# Patient Record
Sex: Female | Born: 1975 | Race: White | Hispanic: No | Marital: Married | Smoking: Never smoker
Health system: Southern US, Community
[De-identification: ages and names within clinical notes are randomized; demographics above are authoritative.]

## PROBLEM LIST (undated history)

## (undated) DIAGNOSIS — F32A Depression, unspecified: Secondary | ICD-10-CM

## (undated) DIAGNOSIS — M545 Low back pain: Secondary | ICD-10-CM

## (undated) DIAGNOSIS — E878 Other disorders of electrolyte and fluid balance, not elsewhere classified: Secondary | ICD-10-CM

## (undated) DIAGNOSIS — F329 Major depressive disorder, single episode, unspecified: Secondary | ICD-10-CM

## (undated) DIAGNOSIS — G43909 Migraine, unspecified, not intractable, without status migrainosus: Secondary | ICD-10-CM

## (undated) DIAGNOSIS — E890 Postprocedural hypothyroidism: Secondary | ICD-10-CM

## (undated) HISTORY — DX: Migraine, unspecified, not intractable, without status migrainosus: G43.909

## (undated) HISTORY — DX: Major depressive disorder, single episode, unspecified: F32.9

## (undated) HISTORY — PX: WISDOM TOOTH EXTRACTION: SHX21

## (undated) HISTORY — DX: Postprocedural hypothyroidism: E89.0

## (undated) HISTORY — DX: Low back pain: M54.5

## (undated) HISTORY — DX: Depression, unspecified: F32.A

## (undated) HISTORY — DX: Other disorders of electrolyte and fluid balance, not elsewhere classified: E87.8

---

## 2004-06-15 HISTORY — PX: THYROIDECTOMY: SHX17

## 2005-06-15 HISTORY — PX: CERVICAL CONE BIOPSY: SUR198

## 2007-06-16 LAB — HM MAMMOGRAPHY

## 2012-04-05 ENCOUNTER — Ambulatory Visit (INDEPENDENT_AMBULATORY_CARE_PROVIDER_SITE_OTHER): Payer: Managed Care, Other (non HMO) | Admitting: Family Medicine

## 2012-04-05 ENCOUNTER — Encounter: Payer: Self-pay | Admitting: Family Medicine

## 2012-04-05 VITALS — BP 125/82 | HR 85 | Temp 98.6°F | Ht 71.0 in | Wt 205.4 lb

## 2012-04-05 DIAGNOSIS — E079 Disorder of thyroid, unspecified: Secondary | ICD-10-CM

## 2012-04-05 DIAGNOSIS — M545 Low back pain, unspecified: Secondary | ICD-10-CM

## 2012-04-05 DIAGNOSIS — R42 Dizziness and giddiness: Secondary | ICD-10-CM

## 2012-04-05 DIAGNOSIS — Z0000 Encounter for general adult medical examination without abnormal findings: Secondary | ICD-10-CM

## 2012-04-05 MED ORDER — MECLIZINE HCL 25 MG PO TABS: 25.0000 mg | ORAL_TABLET | Freq: Three times a day (TID) | ORAL | Status: DC | PRN

## 2012-04-05 NOTE — Patient Instructions (Addendum)
Digestive health probiotic Health by [facility]   Preventive Care for Adults, Female A healthy lifestyle and preventive care can promote health and wellness. Preventive health guidelines for women include the following key practices.  A routine yearly physical is a good way to check with your caregiver about your health and preventive screening. It is a chance to share any concerns and updates on your health, and to receive a thorough exam.  Visit your dentist for a routine exam and preventive care every 6 months. Brush your teeth twice a day and floss once a day. Good oral hygiene prevents tooth decay and gum disease.  The frequency of eye exams is based on your age, health, family medical history, use of contact lenses, and other factors. Follow your caregiver's recommendations for frequency of eye exams.  Eat a healthy diet. Foods like vegetables, fruits, whole grains, low-fat dairy products, and lean protein foods contain the nutrients you need without too many calories. Decrease your intake of foods high in solid fats, added sugars, and salt. Eat the right amount of calories for you.Get information about a proper diet from your caregiver, if necessary.  Regular physical exercise is one of the most important things you can do for your health. Most adults should get at least 150 minutes of moderate-intensity exercise (any activity that increases your heart rate and causes you to sweat) each week. In addition, most adults need muscle-strengthening exercises on 2 or more days a week.  Maintain a healthy weight. The body mass index (BMI) is a screening tool to identify possible weight problems. It provides an estimate of body fat based on height and weight. Your caregiver can help determine your BMI, and can help you achieve or maintain a healthy weight.For adults 20 years and older:  A BMI below 18.5 is considered underweight.  A BMI of 18.5 to 24.9 is normal.  A BMI of 25 to 29.9 is considered  overweight.  A BMI of 30 and above is considered obese.  Maintain normal blood lipids and cholesterol levels by exercising and minimizing your intake of saturated fat. Eat a balanced diet with plenty of fruit and vegetables. Blood tests for lipids and cholesterol should begin at age 74 and be repeated every 5 years. If your lipid or cholesterol levels are high, you are over 50, or you are at high risk for heart disease, you may need your cholesterol levels checked more frequently.Ongoing high lipid and cholesterol levels should be treated with medicines if diet and exercise are not effective.  If you smoke, find out from your caregiver how to quit. If you do not use tobacco, do not start.  If you are pregnant, do not drink alcohol. If you are breastfeeding, be very cautious about drinking alcohol. If you are not pregnant and choose to drink alcohol, do not exceed 1 drink per day. One drink is considered to be 12 ounces (355 mL) of beer, 5 ounces (148 mL) of wine, or 1.5 ounces (44 mL) of liquor.  Avoid use of street drugs. Do not share needles with anyone. Ask for help if you need support or instructions about stopping the use of drugs.  High blood pressure causes heart disease and increases the risk of stroke. Your blood pressure should be checked at least every 1 to 2 years. Ongoing high blood pressure should be treated with medicines if weight loss and exercise are not effective.  If you are 32 to 36 years old, ask your caregiver if you  should take aspirin to prevent strokes.  Diabetes screening involves taking a blood sample to check your fasting blood sugar level. This should be done once every 3 years, after age 69, if you are within normal weight and without risk factors for diabetes. Testing should be considered at a younger age or be carried out more frequently if you are overweight and have at least 1 risk factor for diabetes.  Breast cancer screening is essential preventive care for  women. You should practice "breast self-awareness." This means understanding the normal appearance and feel of your breasts and may include breast self-examination. Any changes detected, no matter how small, should be reported to a caregiver. Women in their 47s and 30s should have a clinical breast exam (CBE) by a caregiver as part of a regular health exam every 1 to 3 years. After age 39, women should have a CBE every year. Starting at age 41, women should consider having a mammography (breast X-ray test) every year. Women who have a family history of breast cancer should talk to their caregiver about genetic screening. Women at a high risk of breast cancer should talk to their caregivers about having magnetic resonance imaging (MRI) and a mammography every year.  The Pap test is a screening test for cervical cancer. A Pap test can show cell changes on the cervix that might become cervical cancer if left untreated. A Pap test is a procedure in which cells are obtained and examined from the lower end of the uterus (cervix).  Women should have a Pap test starting at age 32.  Between ages 42 and 67, Pap tests should be repeated every 2 years.  Beginning at age 68, you should have a Pap test every 3 years as long as the past 3 Pap tests have been normal.  Some women have medical problems that increase the chance of getting cervical cancer. Talk to your caregiver about these problems. It is especially important to talk to your caregiver if a new problem develops soon after your last Pap test. In these cases, your caregiver may recommend more frequent screening and Pap tests.  The above recommendations are the same for women who have or have not gotten the vaccine for human papillomavirus (HPV).  If you had a hysterectomy for a problem that was not cancer or a condition that could lead to cancer, then you no longer need Pap tests. Even if you no longer need a Pap test, a regular exam is a good idea to make  sure no other problems are starting.  If you are between ages 18 and 37, and you have had normal Pap tests going back 10 years, you no longer need Pap tests. Even if you no longer need a Pap test, a regular exam is a good idea to make sure no other problems are starting.  If you have had past treatment for cervical cancer or a condition that could lead to cancer, you need Pap tests and screening for cancer for at least 20 years after your treatment.  If Pap tests have been discontinued, risk factors (such as a new sexual partner) need to be reassessed to determine if screening should be resumed.  The HPV test is an additional test that may be used for cervical cancer screening. The HPV test looks for the virus that can cause the cell changes on the cervix. The cells collected during the Pap test can be tested for HPV. The HPV test could be used to screen  women aged 34 years and older, and should be used in women of any age who have unclear Pap test results. After the age of 101, women should have HPV testing at the same frequency as a Pap test.  Colorectal cancer can be detected and often prevented. Most routine colorectal cancer screening begins at the age of 33 and continues through age 70. However, your caregiver may recommend screening at an earlier age if you have risk factors for colon cancer. On a yearly basis, your caregiver may provide home test kits to check for hidden blood in the stool. Use of a small camera at the end of a tube, to directly examine the colon (sigmoidoscopy or colonoscopy), can detect the earliest forms of colorectal cancer. Talk to your caregiver about this at age 47, when routine screening begins. Direct examination of the colon should be repeated every 5 to 10 years through age 58, unless early forms of pre-cancerous polyps or small growths are found.  Hepatitis C blood testing is recommended for all people born from [birth year] through 1965 and any individual with known risks  for hepatitis C.  Practice safe sex. Use condoms and avoid high-risk sexual practices to reduce the spread of sexually transmitted infections (STIs). STIs include gonorrhea, chlamydia, syphilis, trichomonas, herpes, HPV, and human immunodeficiency virus (HIV). Herpes, HIV, and HPV are viral illnesses that have no cure. They can result in disability, cancer, and death. Sexually active women aged 60 and younger should be checked for chlamydia. Older women with new or multiple partners should also be tested for chlamydia. Testing for other STIs is recommended if you are sexually active and at increased risk.  Osteoporosis is a disease in which the bones lose minerals and strength with aging. This can result in serious bone fractures. The risk of osteoporosis can be identified using a bone density scan. Women ages 65 and over and women at risk for fractures or osteoporosis should discuss screening with their caregivers. Ask your caregiver whether you should take a calcium supplement or vitamin D to reduce the rate of osteoporosis.  Menopause can be associated with physical symptoms and risks. Hormone replacement therapy is available to decrease symptoms and risks. You should talk to your caregiver about whether hormone replacement therapy is right for you.  Use sunscreen with sun protection factor (SPF) of 30 or more. Apply sunscreen liberally and repeatedly throughout the day. You should seek shade when your shadow is shorter than you. Protect yourself by wearing long sleeves, pants, a wide-brimmed hat, and sunglasses year round, whenever you are outdoors.  Once a month, do a whole body skin exam, using a mirror to look at the skin on your back. Notify your caregiver of new moles, moles that have irregular borders, moles that are larger than a pencil eraser, or moles that have changed in shape or color.  Stay current with required immunizations.  Influenza. You need a dose every fall (or winter). The  composition of the flu vaccine changes each year, so being vaccinated once is not enough.  Pneumococcal polysaccharide. You need 1 to 2 doses if you smoke cigarettes or if you have certain chronic medical conditions. You need 1 dose at age 60 (or older) if you have never been vaccinated.  Tetanus, diphtheria, pertussis (Tdap, Td). Get 1 dose of Tdap vaccine if you are younger than age 75, are over 58 and have contact with an infant, are a Research scientist (physical sciences), are pregnant, or simply want to be protected from  whooping cough. After that, you need a Td booster dose every 10 years. Consult your caregiver if you have not had at least 3 tetanus and diphtheria-containing shots sometime in your life or have a deep or dirty wound.  HPV. You need this vaccine if you are a woman age 55 or younger. The vaccine is given in 3 doses over 6 months.  Measles, mumps, rubella (MMR). You need at least 1 dose of MMR if you were born in 1957 or later. You may also need a second dose.  Meningococcal. If you are age 75 to 87 and a first-year college student living in a residence hall, or have one of several medical conditions, you need to get vaccinated against meningococcal disease. You may also need additional booster doses.  Zoster (shingles). If you are age 37 or older, you should get this vaccine.  Varicella (chickenpox). If you have never had chickenpox or you were vaccinated but received only 1 dose, talk to your caregiver to find out if you need this vaccine.  Hepatitis A. You need this vaccine if you have a specific risk factor for hepatitis A virus infection or you simply wish to be protected from this disease. The vaccine is usually given as 2 doses, 6 to 18 months apart.  Hepatitis B. You need this vaccine if you have a specific risk factor for hepatitis B virus infection or you simply wish to be protected from this disease. The vaccine is given in 3 doses, usually over 6 months. Preventive Services /  Frequency Ages 49 to 35  Blood pressure check.** / Every 1 to 2 years.  Lipid and cholesterol check.** / Every 5 years beginning at age 75.  Clinical breast exam.** / Every 3 years for women in their 49s and 30s.  Pap test.** / Every 2 years from ages 69 through 66. Every 3 years starting at age 32 through age 78 or 67 with a history of 3 consecutive normal Pap tests.  HPV screening.** / Every 3 years from ages 74 through ages 67 to 41 with a history of 3 consecutive normal Pap tests.  Hepatitis C blood test.** / For any individual with known risks for hepatitis C.  Skin self-exam. / Monthly.  Influenza immunization.** / Every year.  Pneumococcal polysaccharide immunization.** / 1 to 2 doses if you smoke cigarettes or if you have certain chronic medical conditions.  Tetanus, diphtheria, pertussis (Tdap, Td) immunization. / A one-time dose of Tdap vaccine. After that, you need a Td booster dose every 10 years.  HPV immunization. / 3 doses over 6 months, if you are 81 and younger.  Measles, mumps, rubella (MMR) immunization. / You need at least 1 dose of MMR if you were born in 1957 or later. You may also need a second dose.  Meningococcal immunization. / 1 dose if you are age 77 to 5 and a first-year college student living in a residence hall, or have one of several medical conditions, you need to get vaccinated against meningococcal disease. You may also need additional booster doses.  Varicella immunization.** / Consult your caregiver.  Hepatitis A immunization.** / Consult your caregiver. 2 doses, 6 to 18 months apart.  Hepatitis B immunization.** / Consult your caregiver. 3 doses usually over 6 months. Ages 57 to 28  Blood pressure check.** / Every 1 to 2 years.  Lipid and cholesterol check.** / Every 5 years beginning at age 44.  Clinical breast exam.** / Every year after age 96.  Mammogram.** / Every year beginning at age 56 and continuing for as long as you are in  good health. Consult with your caregiver.  Pap test.** / Every 3 years starting at age 81 through age 71 or 8 with a history of 3 consecutive normal Pap tests.  HPV screening.** / Every 3 years from ages 70 through ages 17 to 80 with a history of 3 consecutive normal Pap tests.  Fecal occult blood test (FOBT) of stool. / Every year beginning at age 80 and continuing until age 41. You may not need to do this test if you get a colonoscopy every 10 years.  Flexible sigmoidoscopy or colonoscopy.** / Every 5 years for a flexible sigmoidoscopy or every 10 years for a colonoscopy beginning at age 54 and continuing until age 48.  Hepatitis C blood test.** / For all people born from [birth year] through 1965 and any individual with known risks for hepatitis C.  Skin self-exam. / Monthly.  Influenza immunization.** / Every year.  Pneumococcal polysaccharide immunization.** / 1 to 2 doses if you smoke cigarettes or if you have certain chronic medical conditions.  Tetanus, diphtheria, pertussis (Tdap, Td) immunization.** / A one-time dose of Tdap vaccine. After that, you need a Td booster dose every 10 years.  Measles, mumps, rubella (MMR) immunization. / You need at least 1 dose of MMR if you were born in 1957 or later. You may also need a second dose.  Varicella immunization.** / Consult your caregiver.  Meningococcal immunization.** / Consult your caregiver.  Hepatitis A immunization.** / Consult your caregiver. 2 doses, 6 to 18 months apart.  Hepatitis B immunization.** / Consult your caregiver. 3 doses, usually over 6 months. Ages 2 and over  Blood pressure check.** / Every 1 to 2 years.  Lipid and cholesterol check.** / Every 5 years beginning at age 97.  Clinical breast exam.** / Every year after age 3.  Mammogram.** / Every year beginning at age 22 and continuing for as long as you are in good health. Consult with your caregiver.  Pap test.** / Every 3 years starting at age 45 through  age 30 or 47 with a 3 consecutive normal Pap tests. Testing can be stopped between 65 and 70 with 3 consecutive normal Pap tests and no abnormal Pap or HPV tests in the past 10 years.  HPV screening.** / Every 3 years from ages 44 through ages 40 or 79 with a history of 3 consecutive normal Pap tests. Testing can be stopped between 65 and 70 with 3 consecutive normal Pap tests and no abnormal Pap or HPV tests in the past 10 years.  Fecal occult blood test (FOBT) of stool. / Every year beginning at age 4 and continuing until age 41. You may not need to do this test if you get a colonoscopy every 10 years.  Flexible sigmoidoscopy or colonoscopy.** / Every 5 years for a flexible sigmoidoscopy or every 10 years for a colonoscopy beginning at age 49 and continuing until age 26.  Hepatitis C blood test.** / For all people born from [birth year] through 1965 and any individual with known risks for hepatitis C.  Osteoporosis screening.** / A one-time screening for women ages 84 and over and women at risk for fractures or osteoporosis.  Skin self-exam. / Monthly.  Influenza immunization.** / Every year.  Pneumococcal polysaccharide immunization.** / 1 dose at age 75 (or older) if you have never been vaccinated.  Tetanus, diphtheria, pertussis (Tdap, Td) immunization. / A one-time  dose of Tdap vaccine if you are over 65 and have contact with an infant, are a Research scientist (physical sciences), or simply want to be protected from whooping cough. After that, you need a Td booster dose every 10 years.  Varicella immunization.** / Consult your caregiver.  Meningococcal immunization.** / Consult your caregiver.  Hepatitis A immunization.** / Consult your caregiver. 2 doses, 6 to 18 months apart.  Hepatitis B immunization.** / Check with your caregiver. 3 doses, usually over 6 months. ** Family history and personal history of risk and conditions may change your caregiver's recommendations. Document Released: 07/28/2001 Document  Revised: 08/24/2011 Document Reviewed: 10/27/2010 [facility] Patient Information 2013 [place], [place].

## 2012-04-10 ENCOUNTER — Encounter: Payer: Self-pay | Admitting: Family Medicine

## 2012-04-10 DIAGNOSIS — M545 Low back pain, unspecified: Secondary | ICD-10-CM

## 2012-04-10 DIAGNOSIS — Z0000 Encounter for general adult medical examination without abnormal findings: Secondary | ICD-10-CM | POA: Insufficient documentation

## 2012-04-10 HISTORY — DX: Low back pain, unspecified: M54.50

## 2012-04-10 NOTE — Assessment & Plan Note (Signed)
Flu shot 03/25/12

## 2012-04-10 NOTE — Progress Notes (Signed)
Patient ID: Lauren Hartman, female   DOB: 1976-05-06, 36 y.o.   MRN: [id number] Lauren Hartman [id number] 10-Dec-1975 04/10/2012      Progress Note New Patient  Subjective  Chief Complaint  Chief Complaint  Patient presents with  . Establish Care    new patient  . Dizziness    X 2 days    HPI  Patient is a 6 female who is in today as a new patient. She's complaining of new onset vertigo for about 2 days. She complains of some discomfort in her right ear and headaches. Thin she developed some episodes of spinning with moving her head quickly. Her symptoms started as viral earlier this week with headache malaise fevers some low-grade abdominal back pain. Mild headache for several days then vertigo in the last day. No tinnitus or loss of hearing. Only mild nasal congestion. No sore throat, chest pain, palpitations, shortness of breath, GI or GU complaints. She was having more trouble to her stomach and her back earlier in the summer while that is still present it is somewhat better. She did have an IUD placed in June of 2013 [facility]. 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 She had a thyroidectomy for some toxic changes.  Past Medical History  Diagnosis Date  . Thyroid disease     goiter at 60, treated with radioactive Iodine and synthroid. with nodules  . Vertigo 04/05/2012  . Low back pain 04/10/2012  . Preventative health care 04/10/2012    Past Surgical History  Procedure Date  . Thyroid removed 2007  . Wisdom tooth extraction 36 yrs old  . Cervical cone biopsy 2007    Family History  Problem Relation Age of Onset  . Hyperlipidemia Mother   . Hypertension Mother   . Macular degeneration Maternal Grandmother   . Cancer Maternal Grandmother 85    colon  . Arthritis Maternal Grandmother   . Heart attack Maternal Grandfather   . Diabetes Paternal Grandmother   . Heart attack Paternal Grandfather     History   Social History   . Marital Status: Married    Spouse Name: N/A    Number of Children: N/A  . Years of Education: N/A   Occupational History  . Not on file.   Social History Main Topics  . Smoking status: Never Smoker   . Smokeless tobacco: Never Used  . Alcohol Use: Yes     occasionally  . Drug Use: No  . Sexually Active: Yes -- Female partner(s)   Other Topics Concern  . Not on file   Social History Narrative  . No narrative on file    Current Outpatient Prescriptions on File Prior to Visit  Medication Sig Dispense Refill  . levonorgestrel (MIRENA) 20 MCG/24HR IUD 1 each by Intrauterine route once.      Marland Kitchen liothyronine (CYTOMEL) 5 MCG tablet 3 tabs po tid        Allergies  Allergen Reactions  . Azithromycin Rash    Review of Systems  Review of Systems  Constitutional: Negative for fever, chills and malaise/fatigue.  HENT: Positive for ear pain and tinnitus. Negative for hearing loss, nosebleeds and congestion.   Eyes: Negative for discharge.  Respiratory: Negative for cough, sputum production, shortness of breath and wheezing.   Cardiovascular: Negative for chest pain, palpitations and leg swelling.  Gastrointestinal: Positive for nausea and vomiting. Negative for heartburn, abdominal pain, diarrhea, constipation and blood in stool.  Genitourinary: Negative for  dysuria, urgency, frequency and hematuria.  Musculoskeletal: Negative for myalgias, back pain and falls.  Skin: Negative for rash.  Neurological: Positive for dizziness and headaches. Negative for tremors, sensory change, focal weakness, loss of consciousness and weakness.  Endo/Heme/Allergies: Negative for polydipsia. Does not bruise/bleed easily.  Psychiatric/Behavioral: Negative for depression and suicidal ideas. The patient is not nervous/anxious and does not have insomnia.     Objective  BP 125/82  Pulse 85  Temp 98.6 F (37 C) (Temporal)  Ht 5\' 11"  (1.803 m)  Wt 205 lb 6.4 oz (93.169 kg)  BMI 28.65 kg/m2  SpO2  96%  Physical Exam  Physical Exam  Constitutional: She is oriented to person, place, and time and well-developed, well-nourished, and in no distress. No distress.  HENT:  Head: Normocephalic and atraumatic.  Right Ear: External ear normal.  Left Ear: External ear normal.  Nose: Nose normal.  Mouth/Throat: Oropharynx is clear and moist. No oropharyngeal exudate.  Eyes: Conjunctivae normal are normal. Pupils are equal, round, and reactive to light. Right eye exhibits no discharge. Left eye exhibits no discharge. No scleral icterus.  Neck: Normal range of motion. Neck supple. No thyromegaly present.  Cardiovascular: Normal rate, regular rhythm, normal heart sounds and intact distal pulses.   No murmur heard. Pulmonary/Chest: Effort normal and breath sounds normal. No respiratory distress. She has no wheezes. She has no rales.  Abdominal: Soft. Bowel sounds are normal. She exhibits no distension and no mass. There is no tenderness.  Musculoskeletal: Normal range of motion. She exhibits no edema and no tenderness.  Lymphadenopathy:    She has no cervical adenopathy.  Neurological: She is alert and oriented to person, place, and time. She has normal reflexes. No cranial nerve deficit. Coordination normal.  Skin: Skin is warm and dry. No rash noted. She is not diaphoretic.  Psychiatric: Mood, memory and affect normal.       Assessment & Plan  Vertigo Sudden onset this week. Improved some today. Given Meclizine to use prn, increase rest and hydration  Preventative health care Flu shot 03/25/12  Low back pain Mild, NSAIDs prn, moist heat and gentle stretching  Thyroid disease Previously seen by endocrine. Stable after coming off Synthroid

## 2012-04-10 NOTE — Assessment & Plan Note (Addendum)
Sudden onset this week. Improved some today. Given Meclizine to use prn, increase rest and hydration

## 2012-04-10 NOTE — Assessment & Plan Note (Signed)
Mild, NSAIDs prn, moist heat and gentle stretching

## 2012-04-10 NOTE — Assessment & Plan Note (Signed)
Previously seen by endocrine. Stable after coming off Synthroid

## 2012-04-12 ENCOUNTER — Other Ambulatory Visit (INDEPENDENT_AMBULATORY_CARE_PROVIDER_SITE_OTHER): Payer: Managed Care, Other (non HMO)

## 2012-04-12 ENCOUNTER — Encounter: Payer: Self-pay | Admitting: Family Medicine

## 2012-04-12 DIAGNOSIS — Z0000 Encounter for general adult medical examination without abnormal findings: Secondary | ICD-10-CM

## 2012-04-12 LAB — CBC
HCT: 40.3 % (ref 36.0–46.0)
Hemoglobin: 13.1 g/dL (ref 12.0–15.0)
MCHC: 32.5 g/dL (ref 30.0–36.0)
MCV: 93.7 fl (ref 78.0–100.0)
RDW: 12.5 % (ref 11.5–14.6)

## 2012-04-12 LAB — RENAL FUNCTION PANEL
BUN: 13 mg/dL (ref 6–23)
Creatinine, Ser: 0.7 mg/dL (ref 0.4–1.2)
GFR: 102.1 mL/min (ref >60.00)
Glucose, Bld: 86 mg/dL (ref 70–99)
Phosphorus: 2.5 mg/dL (ref 2.3–4.6)
Sodium: 138 mEq/L (ref 135–145)

## 2012-04-12 LAB — LIPID PANEL
Cholesterol: 175 mg/dL (ref 0–200)
HDL: 44 mg/dL (ref >39.00)
Triglycerides: 70 mg/dL (ref 0.0–149.0)
VLDL: 14 mg/dL (ref 0.0–40.0)

## 2012-04-12 LAB — HEPATIC FUNCTION PANEL
AST: 17 U/L (ref 0–37)
Albumin: 3.7 g/dL (ref 3.5–5.2)
Total Bilirubin: 0.8 mg/dL (ref 0.3–1.2)

## 2012-05-20 ENCOUNTER — Other Ambulatory Visit: Payer: Self-pay | Admitting: Obstetrics and Gynecology

## 2012-05-20 DIAGNOSIS — N6459 Other signs and symptoms in breast: Secondary | ICD-10-CM

## 2012-05-30 ENCOUNTER — Ambulatory Visit
Admission: RE | Admit: 2012-05-30 | Discharge: 2012-05-30 | Disposition: A | Discharge status: Home / Self Care | Payer: Managed Care, Other (non HMO) | Source: Ambulatory Visit | Attending: Obstetrics and Gynecology | Admitting: Obstetrics and Gynecology

## 2012-05-30 DIAGNOSIS — N6459 Other signs and symptoms in breast: Secondary | ICD-10-CM

## 2012-06-16 ENCOUNTER — Ambulatory Visit (INDEPENDENT_AMBULATORY_CARE_PROVIDER_SITE_OTHER): Payer: BC Managed Care – PPO | Admitting: Family Medicine

## 2012-06-16 ENCOUNTER — Encounter: Payer: Self-pay | Admitting: Family Medicine

## 2012-06-16 VITALS — BP 120/80 | HR 72 | Temp 98.2°F | Ht 71.0 in | Wt 205.0 lb

## 2012-06-16 DIAGNOSIS — G43009 Migraine without aura, not intractable, without status migrainosus: Secondary | ICD-10-CM

## 2012-06-16 MED ORDER — RIZATRIPTAN BENZOATE 10 MG PO TABS: ORAL_TABLET | ORAL | Status: DC

## 2012-06-16 NOTE — Progress Notes (Signed)
OFFICE NOTE  06/16/2012  CC:  Chief Complaint  Patient presents with  . Migraine    x 2 days, has taken Maxalt-ODT in past, but is out; has tried Sudafed and Advil yesterday; having nausea and photophonia     HPI: Patient is a 37 y.o. Caucasian female who is here for headache.  Describes hx of migraine HA's years ago--they went away after she d/c'd her OCPs.    Says onset of HA was yesterday upon waking up in morning, had visual sx's first --"glittery, sparkly things in vision".  +Nausea but no vomiting.  Photo/phono phobia+. Throbbing pain in entire head and neck, but focused in frontal region.  Mild runny nose today but otherwise no recent illness.  No recent worsened stress or other trigger such as change in sleep schedule or eating habits. Tried sudafed pain and pressure and it didn't help yesterday or today.  Also took 3 advils about 2 hours ago.   Pertinent PMH:  Past Medical History  Diagnosis Date  . Thyroid disease     goiter at 42, treated with radioactive Iodine and synthroid. with nodules  . Vertigo 04/05/2012  . Low back pain 04/10/2012  . Preventative health care 04/10/2012    MEDS:  Outpatient Prescriptions Prior to Visit  Medication Sig Dispense Refill  . levonorgestrel (MIRENA) 20 MCG/24HR IUD 1 each by Intrauterine route once.      Marland Kitchen liothyronine (CYTOMEL) 5 MCG tablet 3 tabs po tid      . meclizine (ANTIVERT) 25 MG tablet Take 1 tablet (25 mg total) by mouth 3 (three) times daily as needed for dizziness or nausea.  30 tablet  0   Last reviewed on 06/16/2012 11:47 AM by Jeoffrey Massed, MD  PE: Blood pressure 120/80, pulse 72, temperature 98.2 F (36.8 C), temperature source Temporal, height 5\' 11"  (1.803 m), weight 205 lb (92.987 kg). Gen: Alert, tired- appearing but nontoxic/NAD.  Patient is oriented to person, place, time, and situation. ENT: Ears: EACs clear, normal epithelium.  TMs with good light reflex and landmarks bilaterally.  Eyes: no injection,  icteris, swelling, or exudate.  EOMI, PERRLA. Nose: no drainage or turbinate edema/swelling.  No injection or focal lesion.  Mouth: lips without lesion/swelling.  Oral mucosa pink and moist.  Dentition intact and without obvious caries or gingival swelling.  Oropharynx without erythema, exudate, or swelling.  Neck - No masses or thyromegaly or limitation in range of motion CV: RRR, no m/r/g.   LUNGS: CTA bilat, nonlabored resps, good aeration in all lung fields. Neuro: CN 2-12 intact bilaterally, strength 5/5 in proximal and distal upper extremities and lower extremities bilaterally.  No sensory deficits.  No tremor.  No disdiadochokinesis.  No ataxia.  Upper extremity and lower extremity DTRs symmetric.  No pronator drift.  LAB: none today  IMPRESSION AND PLAN:  Migraine HA w/out aura: maxalt 10mg  rx'd.  Therapeutic expectations and side effect profile of medication discussed today.  Patient's questions answered.   FOLLOW UP: prn

## 2012-09-14 ENCOUNTER — Other Ambulatory Visit: Payer: Self-pay | Admitting: Family Medicine

## 2012-09-15 NOTE — Telephone Encounter (Signed)
eScribe request for refill on MAXALT Last filled - 06/2012, #9 X 1 Last seen on - 06/16/12 Follow up -  Last 2 BP's in office 120/80, 125/82 RX sent per [facility] protocol

## 2012-11-14 ENCOUNTER — Ambulatory Visit (INDEPENDENT_AMBULATORY_CARE_PROVIDER_SITE_OTHER): Payer: BC Managed Care – PPO | Admitting: Family Medicine

## 2012-11-14 ENCOUNTER — Encounter: Payer: Self-pay | Admitting: Family Medicine

## 2012-11-14 VITALS — BP 97/72 | HR 81 | Temp 98.0°F | Resp 14 | Wt 202.5 lb

## 2012-11-14 DIAGNOSIS — E878 Other disorders of electrolyte and fluid balance, not elsewhere classified: Secondary | ICD-10-CM

## 2012-11-14 DIAGNOSIS — R42 Dizziness and giddiness: Secondary | ICD-10-CM

## 2012-11-14 MED ORDER — MECLIZINE HCL 25 MG PO TABS: 25.0000 mg | ORAL_TABLET | Freq: Three times a day (TID) | ORAL | Status: DC | PRN

## 2012-11-14 NOTE — Progress Notes (Signed)
OFFICE NOTE  11/20/2012  CC:  Chief Complaint  Patient presents with  . Establish Care    NP transfer [from Dr. Abner Greenspan; Pt c/o Dizziness w/nausea & H/A x1 wk.     HPI: Patient is a 37 y.o. Caucasian female who is here to transfer care from Dr. Abner Greenspan to me and for discussion of dizziness.   Describes 7-10 day history of a disequilibrium feeling in her head, lasts 2-3 seconds at a time, sometimes triggered by something such as going from sitting to standing or with a turn of head or upper body, but other times it is present w/out any of these things happening.  Happens regardless of hunger status. No HA's, no n/v, no diarrhea.  No vision or hearing changes, no ringing in ears.  Pt denies any recent emotional/social stressors: says relationships at home and work are all fine.  Admits work is stressful but denies anything new recently. Denies depressed mood.  No recent med changes, no OTC/HERBALS.    Review of EMR shows similar complaint in 03/2012 for which she was rx'd meclizine but says she never got this med.  She did not even recall this happening before until I mentioned it today.  Pertinent PMH:  Past Medical History  Diagnosis Date  . Hypothyroidism     Multinodular goiter at age 19, treated with radioactive Iodine, then subsequently with synthroid.(endo MD is Dr. Karen Kays with [facility]).  . Vertigo 04/05/2012  . Low back pain 04/10/2012    MEDS:  Cytomel tab tid (no recent dose changes), mirena IUD  PE: Blood pressure 97/72, pulse 81, temperature 98 F (36.7 C), temperature source Oral, resp. rate 14, weight 202 lb 8 oz (91.853 kg), SpO2 98.00%. Orthostatics: supine-97/72, upright 106/64, standing 118/60 (no HR measurements were obtained). Gen: Alert, well appearing.  Patient is oriented to person, place, time, and situation. ENT: Ears: EACs clear, normal epithelium.  TMs with good light reflex and landmarks bilaterally.  Eyes: no injection, icteris, swelling,  or exudate.  EOMI, PERRLA. Nose: no drainage or turbinate edema/swelling.  No injection or focal lesion.  Mouth: lips without lesion/swelling.  Oral mucosa pink and moist.  Dentition intact and without obvious caries or gingival swelling.  Oropharynx without erythema, exudate, or swelling.  Neck - No masses or thyromegaly or limitation in range of motion CV: RRR, no m/r/g.   LUNGS: CTA bilat, nonlabored resps, good aeration in all lung fields. ABD: soft, NT/ND EXT: no clubbing, cyanosis, or edema.  Neuro: CN 2-12 intact bilaterally, strength 5/5 in proximal and distal upper extremities and lower extremities bilaterally.  No sensory deficits.  No tremor.  No disdiadochokinesis.  No ataxia.  Upper extremity and lower extremity DTRs symmetric.  No pronator drift. Dix Halpike maneuvers NEGATIVE.  Labs: POC Hb was 14.8 POC glucose was 85 (last ate lunch 2 hours ago)  IMPRESSION AND PLAN:  Transfer pt from Dr. Abner Greenspan.  Disequilibrium No red flags for worrisome intracranial pathology. Doubt med or metabolic problem. No indication for any neuroimaging at this time. Watchful waiting approach discussed and agreed upon.  Offered neuro referral today but she declined.  I made it clear we could ask neurologist to see her any time if she changed her mind. I recommended she give meclizine a try.  Therapeutic expectations and side effect profile of medication discussed today.  Patient's questions answered.   Signs/symptoms to call or return for were reviewed and pt expressed understanding.  An After Visit Summary was printed  and given to the patient.  FOLLOW UP: prn

## 2012-11-17 ENCOUNTER — Telehealth: Payer: Self-pay | Admitting: *Deleted

## 2012-11-17 NOTE — Telephone Encounter (Signed)
Pt reports she was seen in office 06.02.14 w/isiopathic dizziness & nausea, was believed to possibly be associated with stress & anxiety; requesting if she could get a Rx to help w/stress & anxiety [lot at work] that she could take PRN and would be able to take while at work and/or driving [non-sedative]/SLS Please advise.

## 2012-11-18 NOTE — Telephone Encounter (Signed)
LMOM with contact name and number for return call RE: medication request and further provider instructions/SLS  

## 2012-11-18 NOTE — Telephone Encounter (Signed)
There is no prn anxiety med that is nonsedating.  She could start an antidepressant, which is taken daily and helps treat chronic anxiety--this would be her best option in order to treat stress/anxiety w/out sedation.  Let me know--thx

## 2012-11-20 ENCOUNTER — Encounter: Payer: Self-pay | Admitting: Family Medicine

## 2012-11-20 DIAGNOSIS — R42 Dizziness and giddiness: Secondary | ICD-10-CM | POA: Insufficient documentation

## 2012-11-20 NOTE — Assessment & Plan Note (Signed)
No red flags for worrisome intracranial pathology. Doubt med or metabolic problem. No indication for any neuroimaging at this time. Watchful waiting approach discussed and agreed upon.  Offered neuro referral today but she declined.  I made it clear we could ask neurologist to see her any time if she changed her mind. I recommended she give meclizine a try.  Therapeutic expectations and side effect profile of medication discussed today.  Patient's questions answered.

## 2012-11-23 NOTE — Telephone Encounter (Signed)
FYI: Spoke with patient about provider suggestions; pt does not feel like she wants to take a daily medication at this point [antidepressant], has not had any problems in the past few days. If she has returning symptoms and/or feels like she needs an OV to discuss the matter, she will call our office/SLS

## 2012-11-23 NOTE — Telephone Encounter (Signed)
Noted  

## 2013-01-02 ENCOUNTER — Telehealth: Payer: Self-pay | Admitting: Emergency Medicine

## 2013-01-02 DIAGNOSIS — L309 Dermatitis, unspecified: Secondary | ICD-10-CM

## 2013-01-02 DIAGNOSIS — Z1283 Encounter for screening for malignant neoplasm of skin: Secondary | ICD-10-CM

## 2013-01-02 NOTE — Telephone Encounter (Signed)
Referral ordered per pt request (Dr. Nicholas Lose, derm)

## 2013-01-02 NOTE — Telephone Encounter (Signed)
Pt dermatology for places on top of her left hand that are raised and itchy.  Patient also wants a full body check.   Please advise.

## 2013-01-02 NOTE — Telephone Encounter (Signed)
Patient wants referral to dermatology office,Dr. Bertrum Sol advise patient

## 2013-01-03 NOTE — Telephone Encounter (Signed)
Patient aware.

## 2013-04-20 ENCOUNTER — Other Ambulatory Visit: Payer: Self-pay

## 2013-07-25 ENCOUNTER — Ambulatory Visit (INDEPENDENT_AMBULATORY_CARE_PROVIDER_SITE_OTHER): Payer: 59 | Admitting: Nurse Practitioner

## 2013-07-25 ENCOUNTER — Encounter: Payer: Self-pay | Admitting: Nurse Practitioner

## 2013-07-25 VITALS — BP 122/76 | HR 82 | Temp 98.1°F | Resp 16 | Ht 71.0 in | Wt 204.0 lb

## 2013-07-25 DIAGNOSIS — J029 Acute pharyngitis, unspecified: Secondary | ICD-10-CM

## 2013-07-25 NOTE — Patient Instructions (Signed)
This may be a viral sore throat infection, aggravated by post-nasal drip. However, if your culture comes back growing bacteria, I will call in an antibiotic. In the meantime, start Neilmed sinus rinses daily. Salt water & listerene gargles several times daily. You may use benzocaine throat lozenges or throat spray for comfort. Rest, sip fluids every hour.  Feel better!  Sore Throat A sore throat is pain, burning, irritation, or scratchiness of the throat. There is often pain or tenderness when swallowing or talking. A sore throat may be accompanied by other symptoms, such as coughing, sneezing, fever, and swollen neck glands. A sore throat is often the first sign of another sickness, such as a cold, flu, strep throat, or mononucleosis (commonly known as mono). Most sore throats go away without medical treatment. CAUSES  The most common causes of a sore throat include:  A viral infection, such as a cold, flu, or mono.  A bacterial infection, such as strep throat, tonsillitis, or whooping cough.  Seasonal allergies.  Dryness in the air.  Irritants, such as smoke or pollution.  Gastroesophageal reflux disease (GERD). HOME CARE INSTRUCTIONS   Only take over-the-counter medicines as directed by your caregiver.  Drink enough fluids to keep your urine clear or pale yellow.  Rest as needed.  Try using throat sprays, lozenges, or sucking on hard candy to ease any pain (if older than 4 years or as directed).  Sip warm liquids, such as broth, herbal tea, or warm water with honey to relieve pain temporarily. You may also eat or drink cold or frozen liquids such as frozen ice pops.  Gargle with salt water (mix 1 tsp salt with 8 oz of water).  Do not smoke and avoid secondhand smoke.  Put a cool-mist humidifier in your bedroom at night to moisten the air. You can also turn on a hot shower and sit in the bathroom with the door closed for 5 10 minutes. SEEK IMMEDIATE MEDICAL CARE IF:  You have  difficulty breathing.  You are unable to swallow fluids, soft foods, or your saliva.  You have increased swelling in the throat.  Your sore throat does not get better in 7 days.  You have nausea and vomiting.  You have a fever or persistent symptoms for more than 2 3 days.  You have a fever and your symptoms suddenly get worse. MAKE SURE YOU:   Understand these instructions.  Will watch your condition.  Will get help right away if you are not doing well or get worse. Document Released: 07/09/2004 Document Revised: 05/18/2012 Document Reviewed: 02/07/2012 [facility] Patient Information 2014 [employer], [place].

## 2013-07-25 NOTE — Progress Notes (Signed)
   Subjective:    Patient ID: Lauren Hartman, female    DOB: [birth year], 38 y.o.   MRN: [id number]  Sore Throat  This is a chronic problem. The current episode started 1 to 4 weeks ago (4 wks). The problem has been waxing and waning. The pain is worse on the left side. There has been no fever. The pain is moderate (hurts when swallow). Associated symptoms include congestion (post-nasal drip), coughing and diarrhea (had diarrhea last 2 days). Pertinent negatives include no ear pain, shortness of breath or trouble swallowing. Exposure to: children have had URI. She has tried NSAIDs for the symptoms. The treatment provided no relief.      Review of Systems  Constitutional: Negative for fever, chills, activity change, appetite change and fatigue.  HENT: Positive for congestion (post-nasal drip), postnasal drip and sore throat. Negative for ear pain and trouble swallowing.   Respiratory: Positive for cough. Negative for chest tightness, shortness of breath and wheezing.   Cardiovascular: Negative for chest pain.  Gastrointestinal: Positive for diarrhea (had diarrhea last 2 days).  Musculoskeletal: Negative for back pain.       Objective:   Physical Exam  Vitals reviewed. Constitutional: She is oriented to person, place, and time. She appears well-developed and well-nourished. No distress.  HENT:  Head: Normocephalic and atraumatic.  Right Ear: External ear normal.  Left Ear: External ear normal.  Mouth/Throat: Oropharynx is clear and moist. No oropharyngeal exudate.  Clear fluid R TM. L TM nml. Posterior pharynx slightly red. Tonsils +1 no exudate.  Eyes: Conjunctivae are normal. Right eye exhibits no discharge. Left eye exhibits no discharge.  Neck: Normal range of motion. Neck supple. No thyromegaly present.  Cardiovascular: Normal rate, regular rhythm and normal heart sounds.   No murmur heard. Pulmonary/Chest: Effort normal and breath sounds normal. No respiratory distress. She has no  wheezes. She has no rales.  Lymphadenopathy:    She has no cervical adenopathy.  Neurological: She is alert and oriented to person, place, and time.  Skin: Skin is warm and dry.  Psychiatric: She has a normal mood and affect. Her behavior is normal. Thought content normal.          Assessment & Plan:  1. Sore throat Likely viral, r/t post-nasal drip  - Upper Respiratory Culture-pending - POCT Rapid Strep A- neg

## 2013-07-28 LAB — CULTURE, UPPER RESPIRATORY: Organism ID, Bacteria: NORMAL

## 2013-10-07 ENCOUNTER — Other Ambulatory Visit: Payer: Self-pay | Admitting: Family Medicine

## 2013-10-09 NOTE — Telephone Encounter (Signed)
Refill request for Maxalt Last filled by MD on - 09/15/2012 #9 x5 Last Appt: 07/25/2013 Next Appt: none Please advise refill?

## 2014-05-08 ENCOUNTER — Ambulatory Visit (INDEPENDENT_AMBULATORY_CARE_PROVIDER_SITE_OTHER): Payer: 59

## 2014-05-08 ENCOUNTER — Ambulatory Visit: Payer: 59

## 2014-05-08 DIAGNOSIS — Z23 Encounter for immunization: Secondary | ICD-10-CM

## 2014-07-30 ENCOUNTER — Telehealth: Payer: Self-pay | Admitting: Family Medicine

## 2014-07-30 NOTE — Telephone Encounter (Signed)
Lauren Hartman has new insurance. She would like a RX called in for Maxalt. She would like 9 pills called in instead of 4./DH

## 2014-07-31 ENCOUNTER — Other Ambulatory Visit: Payer: Self-pay | Admitting: Family Medicine

## 2014-07-31 MED ORDER — RIZATRIPTAN BENZOATE 10 MG PO TABS: ORAL_TABLET | ORAL | Status: AC

## 2014-07-31 NOTE — Telephone Encounter (Signed)
Patient aware.

## 2014-07-31 NOTE — Telephone Encounter (Signed)
Pt is overdue for routine office f/u. I'll do this rx for #9 maxalt with 1 RF but she needs to come in for o/v before any FURTHER maxalt will be authorized (she can either do 15 min f/u migraine visit or 30 min CPE/fasting).-thx

## 2014-12-14 ENCOUNTER — Encounter: Payer: Self-pay | Admitting: Family Medicine

## 2015-03-26 ENCOUNTER — Other Ambulatory Visit (HOSPITAL_BASED_OUTPATIENT_CLINIC_OR_DEPARTMENT_OTHER): Payer: Managed Care, Other (non HMO)

## 2015-03-26 ENCOUNTER — Ambulatory Visit (INDEPENDENT_AMBULATORY_CARE_PROVIDER_SITE_OTHER): Payer: Managed Care, Other (non HMO)

## 2015-03-26 ENCOUNTER — Encounter: Payer: Self-pay | Admitting: Family Medicine

## 2015-03-26 ENCOUNTER — Other Ambulatory Visit: Payer: Self-pay | Admitting: Family Medicine

## 2015-03-26 ENCOUNTER — Telehealth: Payer: Self-pay | Admitting: Family Medicine

## 2015-03-26 ENCOUNTER — Ambulatory Visit (INDEPENDENT_AMBULATORY_CARE_PROVIDER_SITE_OTHER): Payer: Managed Care, Other (non HMO) | Admitting: Family Medicine

## 2015-03-26 VITALS — BP 129/89 | HR 79 | Temp 98.4°F | Resp 18 | Ht 71.0 in | Wt 209.0 lb

## 2015-03-26 DIAGNOSIS — R1030 Lower abdominal pain, unspecified: Secondary | ICD-10-CM

## 2015-03-26 DIAGNOSIS — R109 Unspecified abdominal pain: Secondary | ICD-10-CM | POA: Insufficient documentation

## 2015-03-26 LAB — CBC WITH DIFFERENTIAL/PLATELET
Basophils Absolute: 0.1 10*3/uL (ref 0.0–0.1)
Basophils Relative: 2 % (ref 0.0–3.0)
EOS PCT: 1.9 % (ref 0.0–5.0)
Eosinophils Absolute: 0.1 10*3/uL (ref 0.0–0.7)
HCT: 43.1 % (ref 36.0–46.0)
Hemoglobin: 14.3 g/dL (ref 12.0–15.0)
LYMPHS ABS: 2.4 10*3/uL (ref 0.7–4.0)
Lymphocytes Relative: 32.8 % (ref 12.0–46.0)
MCHC: 33.2 g/dL (ref 30.0–36.0)
MCV: 93.8 fl (ref 78.0–100.0)
MONO ABS: 0.8 10*3/uL (ref 0.1–1.0)
MONOS PCT: 10.6 % (ref 3.0–12.0)
NEUTROS ABS: 3.9 10*3/uL (ref 1.4–7.7)
NEUTROS PCT: 52.7 % (ref 43.0–77.0)
PLATELETS: 284 10*3/uL (ref 150.0–400.0)
RBC: 4.59 Mil/uL (ref 3.87–5.11)
RDW: 12.6 % (ref 11.5–15.5)
WBC: 7.4 10*3/uL (ref 4.0–10.5)

## 2015-03-26 LAB — POCT URINALYSIS DIPSTICK
BILIRUBIN UA: NEGATIVE
Blood, UA: NEGATIVE
Glucose, UA: NEGATIVE
KETONES UA: NEGATIVE
LEUKOCYTES UA: NEGATIVE
Nitrite, UA: NEGATIVE
Protein, UA: NEGATIVE
Urobilinogen, UA: 0.2
pH, UA: 6

## 2015-03-26 LAB — COMPREHENSIVE METABOLIC PANEL
ALK PHOS: 58 U/L (ref 39–117)
ALT: 19 U/L (ref 0–35)
AST: 18 U/L (ref 0–37)
Albumin: 4.2 g/dL (ref 3.5–5.2)
BUN: 11 mg/dL (ref 6–23)
CHLORIDE: 105 meq/L (ref 96–112)
CO2: 29 meq/L (ref 19–32)
Calcium: 9.5 mg/dL (ref 8.4–10.5)
Creatinine, Ser: 0.66 mg/dL (ref 0.40–1.20)
GFR: 105.79 mL/min (ref >60.00)
GLUCOSE: 89 mg/dL (ref 70–99)
POTASSIUM: 4.4 meq/L (ref 3.5–5.1)
SODIUM: 139 meq/L (ref 135–145)
Total Bilirubin: 0.6 mg/dL (ref 0.2–1.2)
Total Protein: 7.2 g/dL (ref 6.0–8.3)

## 2015-03-26 MED ORDER — NAPROXEN 500 MG PO TABS: 500.0000 mg | ORAL_TABLET | Freq: Two times a day (BID) | ORAL | Status: AC

## 2015-03-26 NOTE — Progress Notes (Signed)
Pre visit review using our clinic review tool, if applicable. No additional management support is needed unless otherwise documented below in the visit note. 

## 2015-03-26 NOTE — Progress Notes (Signed)
Subjective:    Patient ID: Lauren Hartman, female    DOB: 1975/08/17, 39 y.o.   MRN: [id number]  HPI  Lower abdominal pain: Patient present since with a ten-day history of achy lower abdominal pain. She states she noticed the pressure and ache approximately 10 days ago but 2 days ago she endorses increased in her pain with sharp radiation of pain to her groin. Patient denies any dysuria, urinary frequency, fever or chills. She denies any bowel changes. Last bowel movement was this morning. Patient becomes tearful, is worried she has colon cancer because somebody in her family has colon cancer. She is also states she's been going to diagnosis and with her prior history of vertigo she feels like she may have a kidney disease. Patient denies any vaginal discharge or irritation. She is married, with no changes in sexual partners. Patient states she has a Mirena IUD. She has a history of thyroid disorder that is followed by endocrine.  Past Medical History  Diagnosis Date  . Postsurgical hypothyroidism     Multinodular goiter at age 25, treated with radioactive Iodine, then subsequently with synthroid and at times was switched to cytomel and then armour thyroid.(endo MD is Dr. Karen Kays with [facility]).  Thyroidectomy 2006.  Marland Kitchen Low back pain 04/10/2012  . 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 Migraine syndrome   . Depression    Allergies  Allergen Reactions  . Azithromycin Rash   Past Surgical History  Procedure Laterality Date  . Thyroidectomy  2006  . Wisdom tooth extraction  39 yrs old  . Cervical cone biopsy  2007   Family History  Problem Relation Age of Onset  . Hyperlipidemia Mother   . Hypertension Mother   . Macular degeneration Maternal Grandmother   . Cancer Maternal Grandmother 39    colon  . Arthritis Maternal Grandmother   . Heart attack Maternal Grandfather   . Diabetes Paternal Grandmother   . Heart attack Paternal Grandfather    Social History    Social History  . Marital Status: Married    Spouse Name: N/A  . Number of Children: N/A  . Years of Education: N/A   Occupational History  . Not on file.   Social History Main Topics  . Smoking status: Never Smoker   . Smokeless tobacco: Never Used  . Alcohol Use: Yes     Comment: occasionally  . Drug Use: No  . Sexual Activity:    Partners: Male   Other Topics Concern  . Not on file   Social History Narrative    Review of Systems Negative, with the exception of above mentioned in HPI    Objective:   Physical Exam BP 129/89 mmHg  Pulse 79  Temp(Src) 98.4 F (36.9 C) (Temporal)  Resp 18  Ht  (1.803 m)  Wt 209 lb (94.802 kg)  BMI 29.16 kg/m2  SpO2 96% Gen: Afebrile. No acute distress. Nontoxic in appearance, well-developed, well-nourished Caucasian female. Talkative. HENT: AT. [place].MMM Eyes:Pupils Equal Round Reactive to light, Extraocular movements intact,  Conjunctiva without redness, discharge or icterus. Neck/lymp/endocrine: Supple, no lymphadenopathy, no thyromegaly Abd: Soft. Flat. Nondistended. Tender to palpation left periumbilical and left lower quadrant. Mild tenderness suprapubic area. BS present. Left lower quadrant fullness Masses palpated.  Psych: Tearful. Normal speech. Normal thought content and judgment..     Assessment & Plan:  1. Lower abdominal pain - Urinalysis negative today. Will obtain ultrasound of the pelvis for Mirena placement/ovary visualization. Labs collected today  CBC/CMP rule out infection and reassure normal kidneys. - Consider CT if pain worsens. - NSAID therapy for pain. - POCT urinalysis dipstick - CBC w/Diff - Comprehensive metabolic panel - Follow-up in one week if no improvement.

## 2015-03-26 NOTE — Patient Instructions (Signed)
Benign Positional Vertigo Vertigo is the feeling that you or your surroundings are moving when they are not. Benign positional vertigo is the most common form of vertigo. The cause of this condition is not serious (is benign). This condition is triggered by certain movements and positions (is positional). This condition can be dangerous if it occurs while you are doing something that could endanger you or others, such as driving.  CAUSES In many cases, the cause of this condition is not known. It may be caused by a disturbance in an area of the inner ear that helps your brain to sense movement and balance. This disturbance can be caused by a viral infection (labyrinthitis), head injury, or repetitive motion. RISK FACTORS This condition is more likely to develop in:  Women.  People who are 50 years of age or older. SYMPTOMS Symptoms of this condition usually happen when you move your head or your eyes in different directions. Symptoms may start suddenly, and they usually last for less than a minute. Symptoms may include:  Loss of balance and falling.  Feeling like you are spinning or moving.  Feeling like your surroundings are spinning or moving.  Nausea and vomiting.  Blurred vision.  Dizziness.  Involuntary eye movement (nystagmus). Symptoms can be mild and cause only slight annoyance, or they can be severe and interfere with daily life. Episodes of benign positional vertigo may return (recur) over time, and they may be triggered by certain movements. Symptoms may improve over time. DIAGNOSIS This condition is usually diagnosed by medical history and a physical exam of the head, neck, and ears. You may be referred to a health care provider who specializes in ear, nose, and throat (ENT) problems (otolaryngologist) or a provider who specializes in disorders of the nervous system (neurologist). You may have additional testing, including:  MRI.  A CT scan.  Eye movement tests. Your  health care provider may ask you to change positions quickly while he or she watches you for symptoms of benign positional vertigo, such as nystagmus. Eye movement may be tested with an electronystagmogram (ENG), caloric stimulation, the Dix-Hallpike test, or the roll test.  An electroencephalogram (EEG). This records electrical activity in your brain.  Hearing tests. TREATMENT Usually, your health care provider will treat this by moving your head in specific positions to adjust your inner ear back to normal. Surgery may be needed in severe cases, but this is rare. In some cases, benign positional vertigo may resolve on its own in 2-4 weeks. HOME CARE INSTRUCTIONS Safety  Move slowly.Avoid sudden body or head movements.  Avoid driving.  Avoid operating heavy machinery.  Avoid doing any tasks that would be dangerous to you or others if a vertigo episode would occur.  If you have trouble walking or keeping your balance, try using a cane for stability. If you feel dizzy or unstable, sit down right away.  Return to your normal activities as told by your health care provider. Ask your health care provider what activities are safe for you. General Instructions  Take over-the-counter and prescription medicines only as told by your health care provider.  Avoid certain positions or movements as told by your health care provider.  Drink enough fluid to keep your urine clear or pale yellow.  Keep all follow-up visits as told by your health care provider. This is important. SEEK MEDICAL CARE IF:  You have a fever.  Your condition gets worse or you develop new symptoms.  Your family or friends   notice any behavioral changes.  Your nausea or vomiting gets worse.  You have numbness or a "pins and needles" sensation. SEEK IMMEDIATE MEDICAL CARE IF:  You have difficulty speaking or moving.  You are always dizzy.  You faint.  You develop severe headaches.  You have weakness in your  legs or arms.  You have changes in your hearing or vision.  You develop a stiff neck.  You develop sensitivity to light.   This information is not intended to replace advice given to you by your health care provider. Make sure you discuss any questions you have with your health care provider.   Document Released: 03/09/2006 Document Revised: 02/20/2015 Document Reviewed: 09/24/2014 Elsevier Interactive Patient Education 2016 [employer] Inc.  

## 2015-03-26 NOTE — Telephone Encounter (Signed)
Patient asked that we call her cell when calling her with her [place] results.

## 2015-03-27 ENCOUNTER — Telehealth: Payer: Self-pay | Admitting: Family Medicine

## 2015-03-27 NOTE — Telephone Encounter (Signed)
Pt has been aware by Dr. Claiborne BillingsKuneff via MyChart.

## 2015-03-28 NOTE — Telephone Encounter (Signed)
Patient was informed of labs and US results by [employer]mychart

## 2015-04-30 ENCOUNTER — Encounter: Payer: Self-pay | Admitting: Family Medicine

## 2015-05-01 ENCOUNTER — Telehealth: Payer: Self-pay | Admitting: Family Medicine

## 2015-05-01 NOTE — Telephone Encounter (Signed)
Received a mychart question from patient concerning her OV 5 weeks ago, she is requesting/asking about CT of her abdomen. Pt was asked to follow up concerning her abd pain, in which she did not. Please advise her if she is still having abdominal issues I will need to evaluate her, we can discuss need of CT at that time, if exam warrants further studies or referral . Thanks.

## 2015-05-01 NOTE — Telephone Encounter (Signed)
Left message for patient to call back and schedule appt if still having abdominal issues.

## 2015-06-07 ENCOUNTER — Ambulatory Visit (INDEPENDENT_AMBULATORY_CARE_PROVIDER_SITE_OTHER): Payer: Managed Care, Other (non HMO)

## 2015-06-07 DIAGNOSIS — Z23 Encounter for immunization: Secondary | ICD-10-CM

## 2015-10-28 ENCOUNTER — Encounter: Payer: Self-pay | Admitting: Gastroenterology

## 2015-12-12 ENCOUNTER — Ambulatory Visit: Payer: Managed Care, Other (non HMO) | Admitting: Gastroenterology

## 2016-08-29 IMAGING — US US TRANSVAGINAL NON-OB
1 series · 14 of 25 positions shown · non-contrast
Comparison: None

CLINICAL DATA: Left lower quadrant pain.

EXAM:
TRANSABDOMINAL AND TRANSVAGINAL ULTRASOUND OF PELVIS
TECHNIQUE: Both transabdominal and transvaginal ultrasound examinations of the
pelvis were performed. Transabdominal technique was performed for
global imaging of the pelvis including uterus, ovaries, adnexal
regions, and pelvic cul-de-sac. It was necessary to proceed with
endovaginal exam following the transabdominal exam to visualize the
uterus, endometrium, ovaries and adnexa are.

[Series 1: us transvaginal non-ob · 0.28mm/px · 14 of 77 slices shown]
[im 1/77]
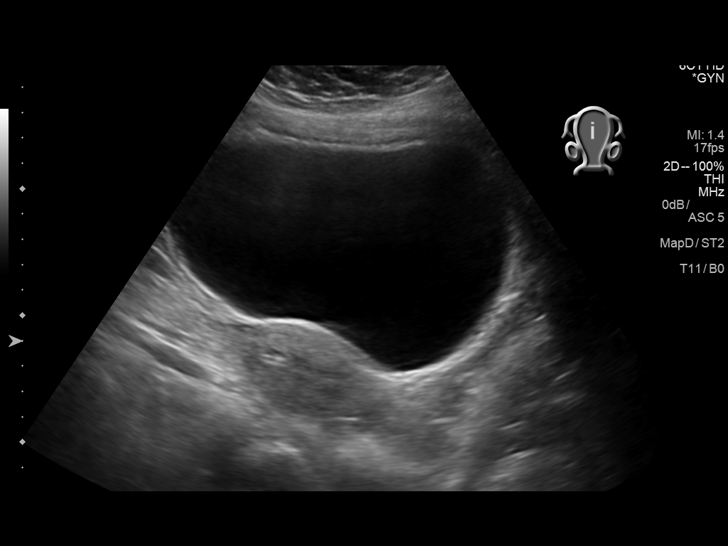
[im 7/77]
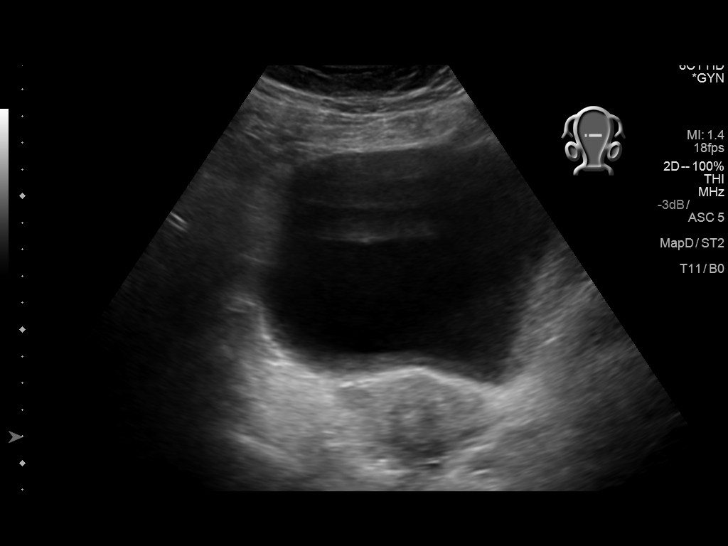
[im 13/77]
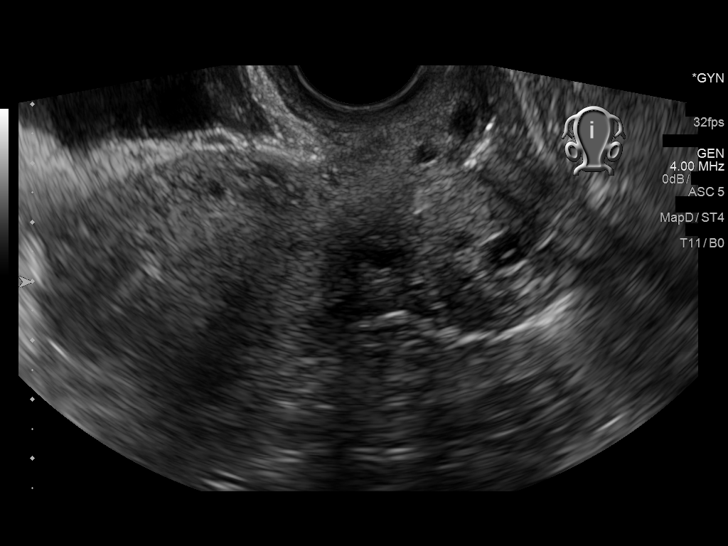
[im 20/77]
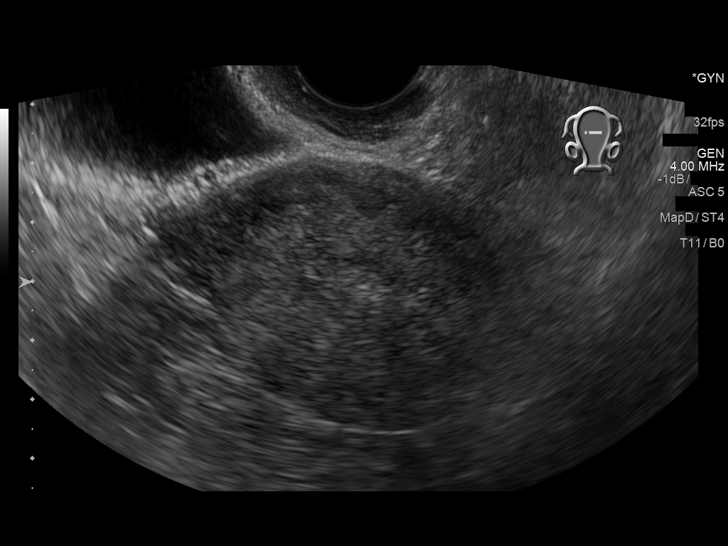
[im 26/77]
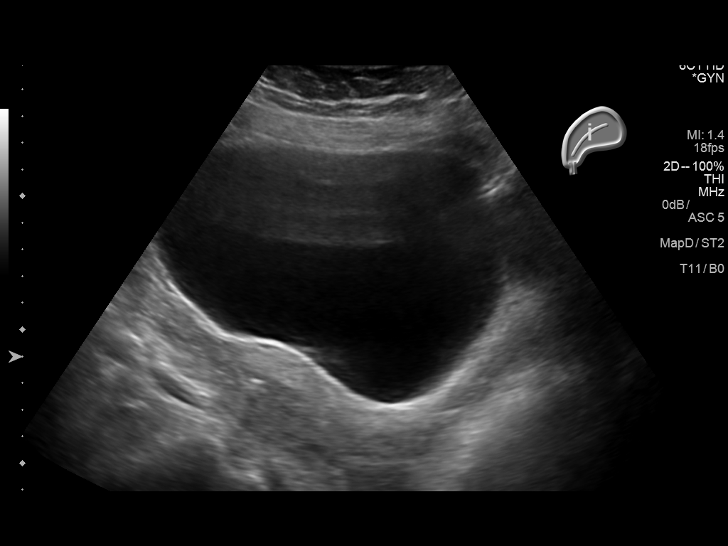
[im 29/77]
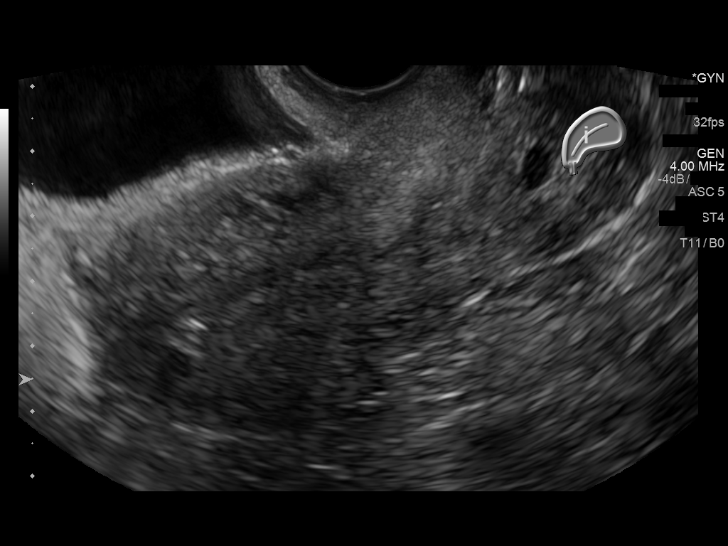
[im 35/77]
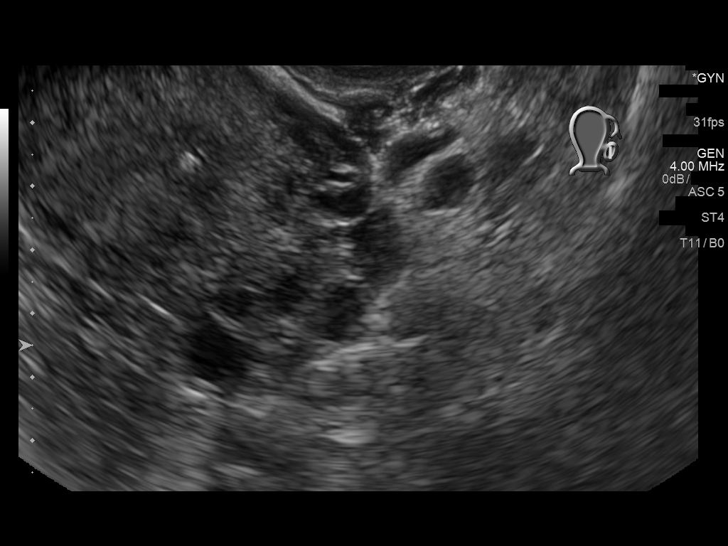
[im 42/77]
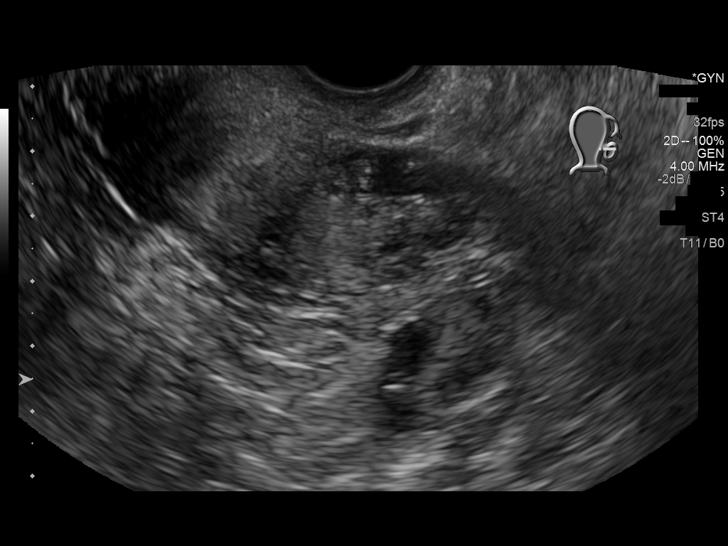
[im 48/77]
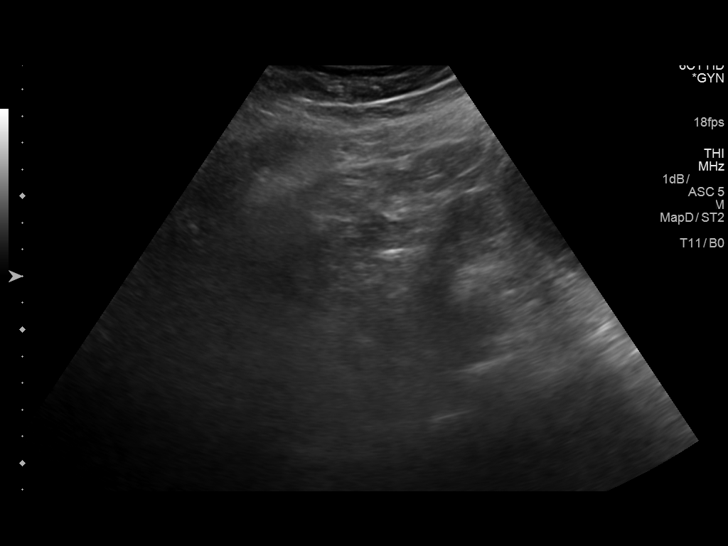
[im 51/77]
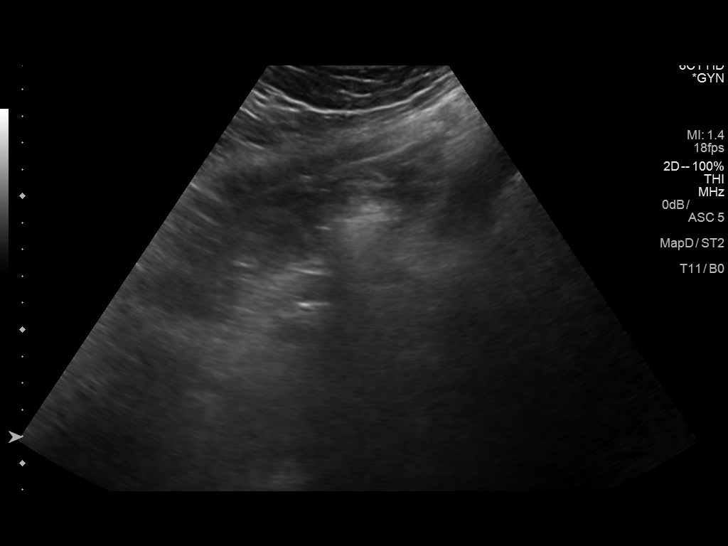
[im 58/77]
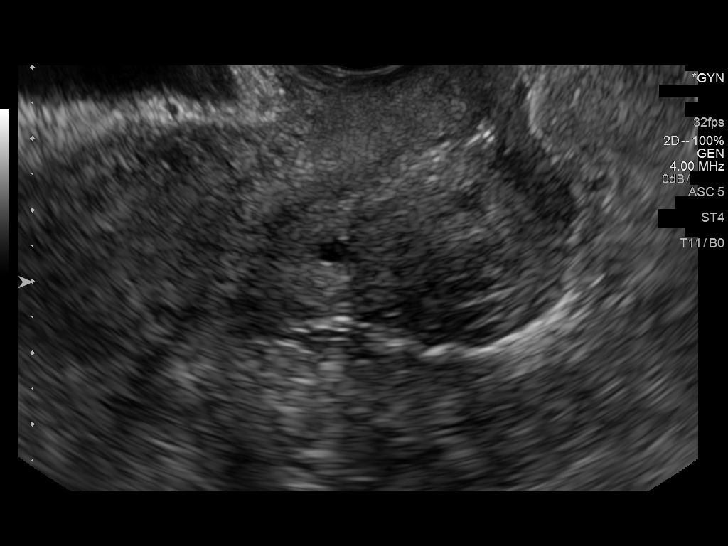
[im 64/77]
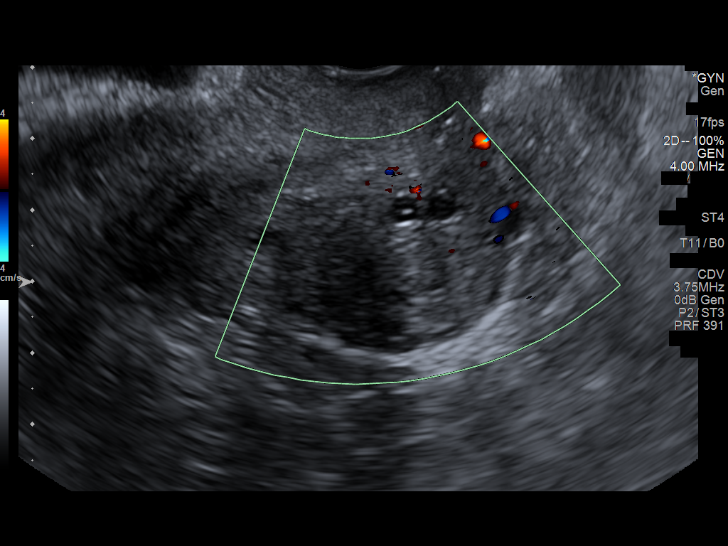
[im 70/77]
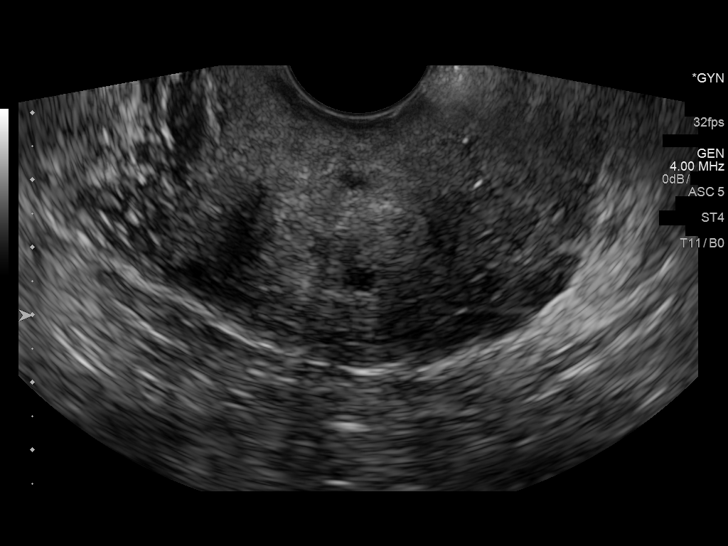
[im 77/77]
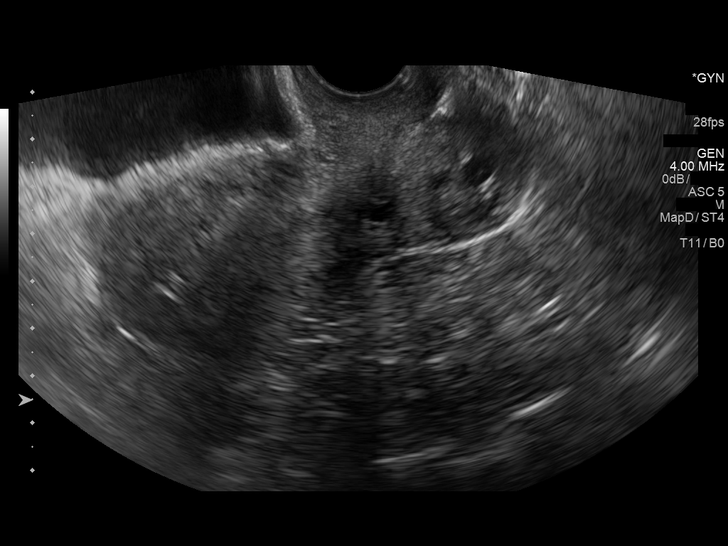

[14 of 25 positions shown; findings below may reference images not displayed]

FINDINGS: Uterus

Measurements: 8.5 x 3.9 x 5.6 cm. Probable posterior fibroid in the
lower uterine segment and upper cervical region measuring up to 3
cm.

Endometrium

Thickness: 8 mm in thickness. Echogenic foci within the fundus,
likely small calcifications.

Right ovary

Measurements: Not definitively visualized due to overlying bowel
gas. No adnexal mass seen.

Left ovary

Measurements: 2.8 x 1.8 x 1.8 cm. Normal appearance/no adnexal mass.

Other findings

No free fluid.
IMPRESSION: Exophytic area posteriorly off the lower uterine segment region near
the cervix, likely posterior fibroid measuring 3 cm.
